# Patient Record
Sex: Female | Born: 1965 | Race: White | Hispanic: No | Marital: Married
Health system: Southern US, Community
[De-identification: ages and names within clinical notes are randomized; demographics above are authoritative.]

---

## 1997-10-28 ENCOUNTER — Other Ambulatory Visit: Admission: RE | Admit: 1997-10-28 | Discharge: 1997-10-28 | Payer: Self-pay | Admitting: Obstetrics and Gynecology

## 1998-04-20 ENCOUNTER — Other Ambulatory Visit: Admission: RE | Admit: 1998-04-20 | Discharge: 1998-04-20 | Payer: Self-pay | Admitting: Obstetrics and Gynecology

## 1998-11-24 ENCOUNTER — Other Ambulatory Visit: Admission: RE | Admit: 1998-11-24 | Discharge: 1998-11-24 | Payer: Self-pay | Admitting: Obstetrics and Gynecology

## 1999-09-02 ENCOUNTER — Ambulatory Visit (HOSPITAL_BASED_OUTPATIENT_CLINIC_OR_DEPARTMENT_OTHER): Admission: RE | Admit: 1999-09-02 | Discharge: 1999-09-02 | Payer: Self-pay | Admitting: *Deleted

## 2000-03-23 ENCOUNTER — Other Ambulatory Visit: Admission: RE | Admit: 2000-03-23 | Discharge: 2000-03-23 | Payer: Self-pay | Admitting: Obstetrics and Gynecology

## 2001-10-30 ENCOUNTER — Other Ambulatory Visit: Admission: RE | Admit: 2001-10-30 | Discharge: 2001-10-30 | Payer: Self-pay | Admitting: Obstetrics and Gynecology

## 2003-02-24 ENCOUNTER — Other Ambulatory Visit: Admission: RE | Admit: 2003-02-24 | Discharge: 2003-02-24 | Payer: Self-pay | Admitting: Obstetrics and Gynecology

## 2004-07-21 ENCOUNTER — Other Ambulatory Visit: Admission: RE | Admit: 2004-07-21 | Discharge: 2004-07-21 | Payer: Self-pay | Admitting: Obstetrics and Gynecology

## 2005-09-28 ENCOUNTER — Other Ambulatory Visit: Admission: RE | Admit: 2005-09-28 | Discharge: 2005-09-28 | Payer: Self-pay | Admitting: Obstetrics and Gynecology

## 2005-12-02 ENCOUNTER — Encounter: Admission: RE | Admit: 2005-12-02 | Discharge: 2005-12-02 | Payer: Self-pay | Admitting: Obstetrics and Gynecology

## 2010-07-25 ENCOUNTER — Encounter: Payer: Self-pay | Admitting: Obstetrics and Gynecology

## 2018-07-10 ENCOUNTER — Other Ambulatory Visit: Payer: Self-pay | Admitting: Obstetrics and Gynecology

## 2018-07-10 DIAGNOSIS — Z803 Family history of malignant neoplasm of breast: Secondary | ICD-10-CM

## 2018-08-09 ENCOUNTER — Other Ambulatory Visit: Payer: Self-pay

## 2018-09-25 ENCOUNTER — Other Ambulatory Visit: Payer: Self-pay

## 2018-10-23 ENCOUNTER — Other Ambulatory Visit: Payer: Self-pay

## 2018-11-27 ENCOUNTER — Ambulatory Visit
Admission: RE | Admit: 2018-11-27 | Discharge: 2018-11-27 | Disposition: A | Discharge status: Home / Self Care | Payer: Managed Care, Other (non HMO) | Source: Ambulatory Visit | Attending: Obstetrics and Gynecology | Admitting: Obstetrics and Gynecology

## 2018-11-27 ENCOUNTER — Other Ambulatory Visit: Payer: Self-pay

## 2018-11-27 DIAGNOSIS — Z803 Family history of malignant neoplasm of breast: Secondary | ICD-10-CM

## 2018-11-27 MED ORDER — GADOBUTROL 1 MMOL/ML IV SOLN
8.0000 mL | Freq: Once | INTRAVENOUS | Status: AC | PRN
  Administered 2018-11-27: 8 mL via INTRAVENOUS

## 2018-11-28 ENCOUNTER — Other Ambulatory Visit: Payer: Self-pay | Admitting: Obstetrics and Gynecology

## 2018-11-28 DIAGNOSIS — R9389 Abnormal findings on diagnostic imaging of other specified body structures: Secondary | ICD-10-CM

## 2018-12-06 ENCOUNTER — Ambulatory Visit
Admission: RE | Admit: 2018-12-06 | Discharge: 2018-12-06 | Disposition: A | Discharge status: Home / Self Care | Payer: Managed Care, Other (non HMO) | Source: Ambulatory Visit | Attending: Obstetrics and Gynecology | Admitting: Obstetrics and Gynecology

## 2018-12-06 ENCOUNTER — Other Ambulatory Visit: Payer: Self-pay

## 2018-12-06 DIAGNOSIS — R9389 Abnormal findings on diagnostic imaging of other specified body structures: Secondary | ICD-10-CM

## 2018-12-06 MED ORDER — GADOBUTROL 1 MMOL/ML IV SOLN
8.0000 mL | Freq: Once | INTRAVENOUS | Status: AC | PRN
  Administered 2018-12-06: 8 mL via INTRAVENOUS

## 2019-08-30 ENCOUNTER — Other Ambulatory Visit: Payer: Self-pay | Admitting: Obstetrics and Gynecology

## 2019-08-30 DIAGNOSIS — Z9189 Other specified personal risk factors, not elsewhere classified: Secondary | ICD-10-CM

## 2019-10-23 ENCOUNTER — Ambulatory Visit
Admission: RE | Admit: 2019-10-23 | Discharge: 2019-10-23 | Disposition: A | Discharge status: Home / Self Care | Payer: Commercial Managed Care - PPO | Source: Ambulatory Visit | Attending: Obstetrics and Gynecology | Admitting: Obstetrics and Gynecology

## 2019-10-23 DIAGNOSIS — Z9189 Other specified personal risk factors, not elsewhere classified: Secondary | ICD-10-CM

## 2019-10-23 MED ORDER — GADOBUTROL 1 MMOL/ML IV SOLN
9.0000 mL | Freq: Once | INTRAVENOUS | Status: AC | PRN
  Administered 2019-10-23: 9 mL via INTRAVENOUS

## 2020-07-11 ENCOUNTER — Other Ambulatory Visit: Payer: Self-pay

## 2020-07-11 ENCOUNTER — Other Ambulatory Visit: Payer: Commercial Managed Care - PPO

## 2020-07-11 DIAGNOSIS — Z20822 Contact with and (suspected) exposure to covid-19: Secondary | ICD-10-CM

## 2020-07-16 LAB — NOVEL CORONAVIRUS, NAA: SARS-CoV-2, NAA: DETECTED — AB

## 2020-12-15 ENCOUNTER — Other Ambulatory Visit: Payer: Self-pay | Admitting: Obstetrics and Gynecology

## 2020-12-15 DIAGNOSIS — Z803 Family history of malignant neoplasm of breast: Secondary | ICD-10-CM

## 2021-01-18 ENCOUNTER — Ambulatory Visit
Admission: RE | Admit: 2021-01-18 | Discharge: 2021-01-18 | Disposition: A | Discharge status: Home / Self Care | Payer: Commercial Managed Care - PPO | Source: Ambulatory Visit | Attending: Obstetrics and Gynecology | Admitting: Obstetrics and Gynecology

## 2021-01-18 ENCOUNTER — Other Ambulatory Visit: Payer: Self-pay

## 2021-01-18 DIAGNOSIS — Z803 Family history of malignant neoplasm of breast: Secondary | ICD-10-CM

## 2021-01-18 MED ORDER — GADOBUTROL 1 MMOL/ML IV SOLN
8.0000 mL | Freq: Once | INTRAVENOUS | Status: AC | PRN
  Administered 2021-01-18: 8 mL via INTRAVENOUS

## 2021-10-15 IMAGING — MR MR BREAST BILAT WO/W CM
9 of 13 series · 33 of 48 positions shown · IV contrast (gadavist)
Comparison: Prior exams including previous breast MRIs, most recent
dated 10/23/2019.

CLINICAL DATA: Family history of breast carcinoma. High risk
screening. Mother diagnosed with breast carcinoma at 68, sister at
55 and maternal aunt at 69. History of previous bilateral benign
breast biopsies in 5151.

LABS:  No labs drawn at time of imaging.
EXAM:
BILATERAL BREAST MRI WITH AND WITHOUT CONTRAST
TECHNIQUE: Multiplanar, multisequence MR images of both breasts were obtained
prior to and following the intravenous administration of 8 ml of
Gadavist

[Series 2: t2_tirm_tra ipat (a-p) · axial · 3.0mm · 0.70mm/px · 1 of 55 slices shown]
[im 1/55]
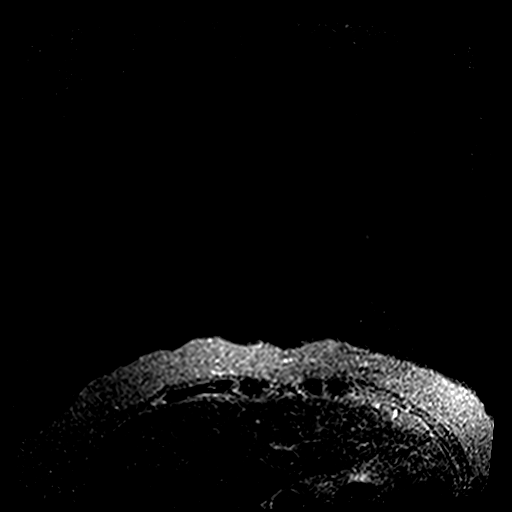

[Series 3: fl3d pre-cm no · axial · non-contrast · 1.2mm · 0.94mm/px · z∈[-113,+59]mm · 4 of 144 slices shown]
[im 1/144]
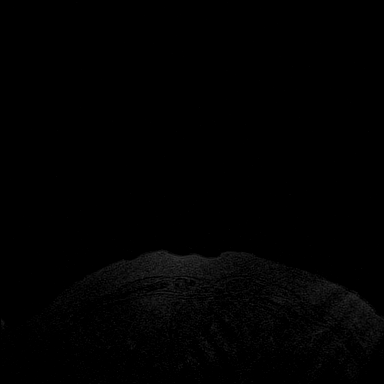
[im 48/144]
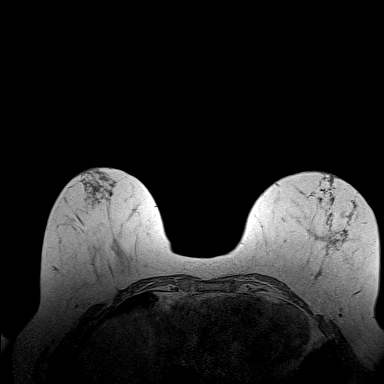
[im 96/144]
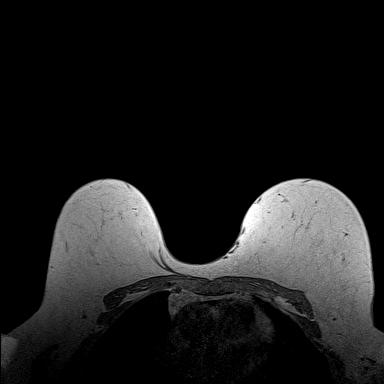
[im 144/144]
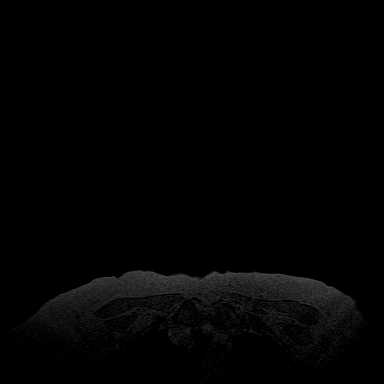

[Series 4: fl3d pre-cm · axial · non-contrast · 1.2mm · 0.94mm/px · z∈[-113,+59]mm · 5 of 144 slices shown]
[im 1/144]
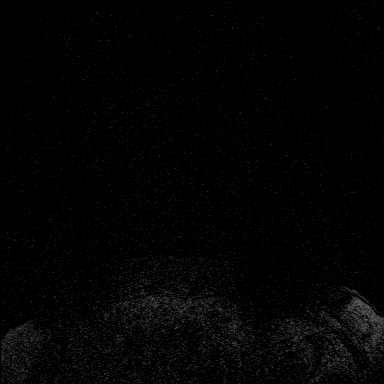
[im 36/144]
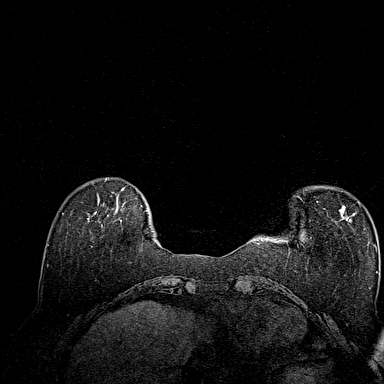
[im 72/144]
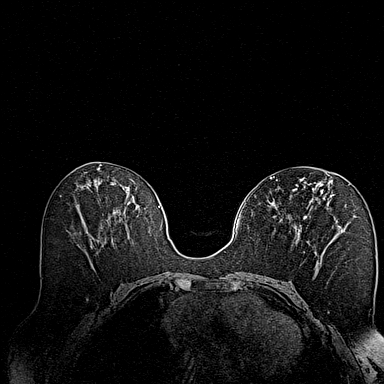
[im 108/144]
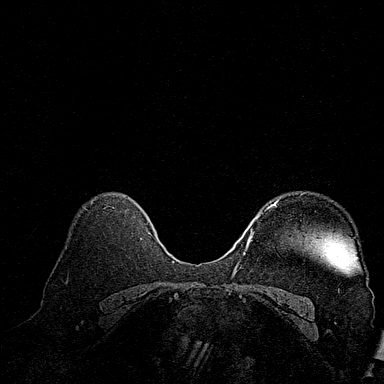
[im 144/144]
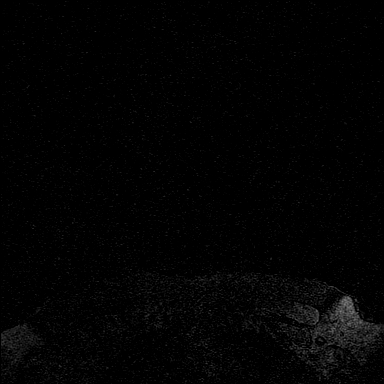

[Series 6: fl3d pre-cm rep · axial · non-contrast · 1.2mm · 0.94mm/px · z∈[-109,+62]mm · 5 of 144 slices shown]
[im 1/144]
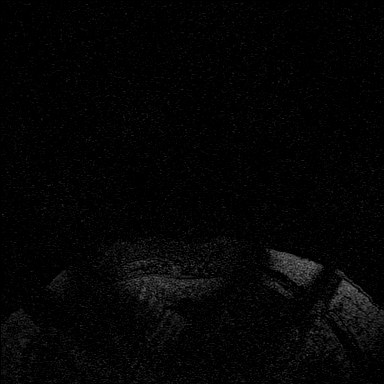
[im 36/144]
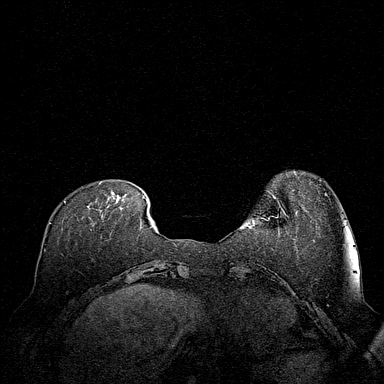
[im 72/144]
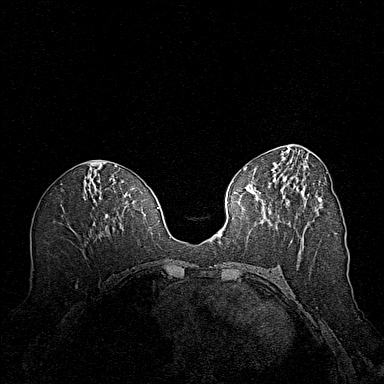
[im 108/144]
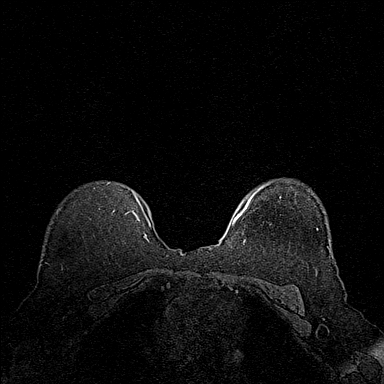
[im 144/144]
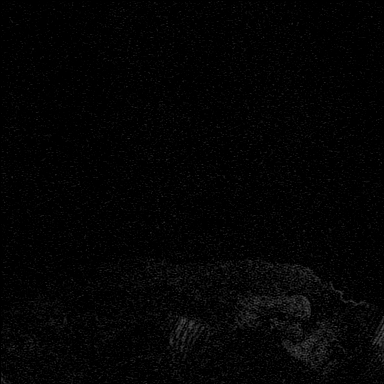

[Series 7: fl3d post-cm 20 · axial · 1.2mm · 0.94mm/px · z∈[-109,+62]mm · 5 of 144 slices shown (1 of 3)]
[im 1/144]
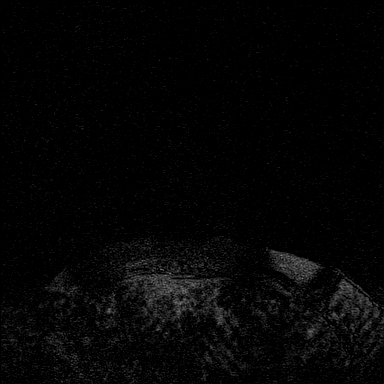
[im 36/144]
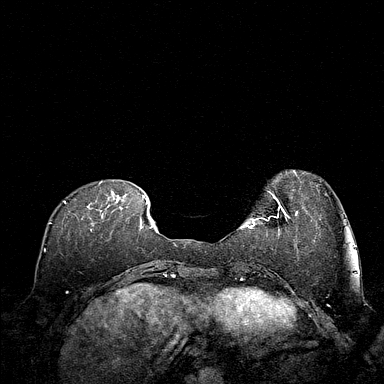
[im 72/144]
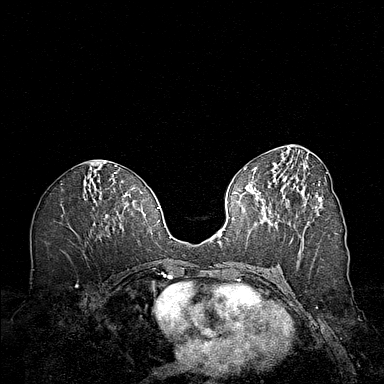
[im 108/144]
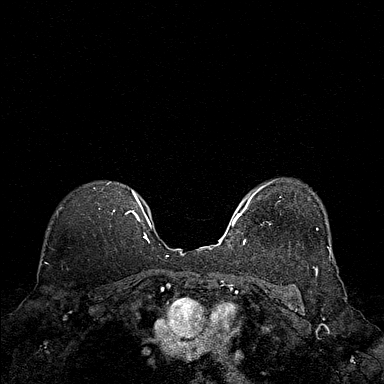
[im 144/144]
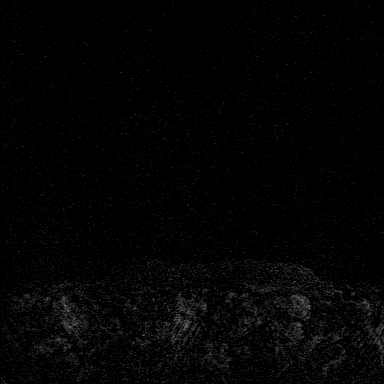

[Series 8: fl3d post-cm 20 · axial · 1.2mm · 0.94mm/px · z∈[-109,+62]mm · 5 of 144 slices shown (2 of 3)]
[im 1/144]
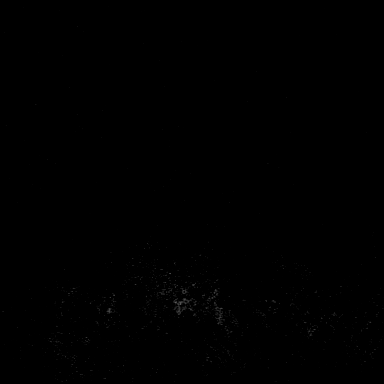
[im 36/144]
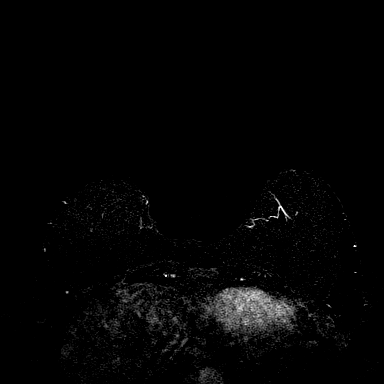
[im 72/144]
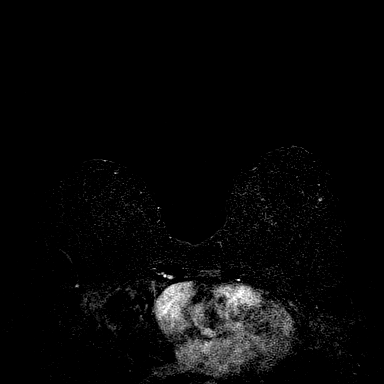
[im 108/144]
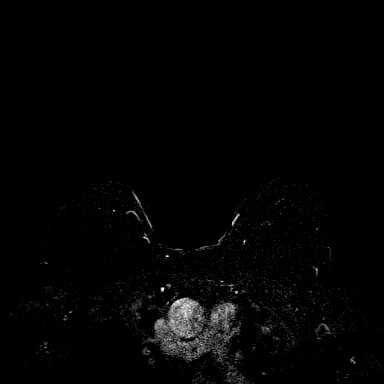
[im 144/144]
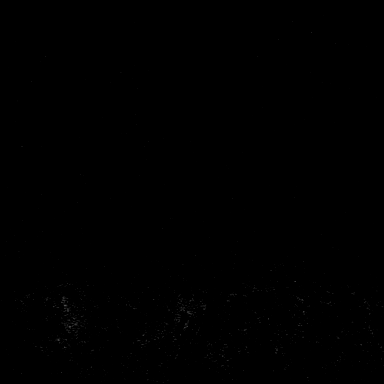

[Series 9: fl3d post-cm 20 · axial · 172.8mm · 0.94mm/px · 1 of 1 slices shown (3 of 3)]
[im 1/1]
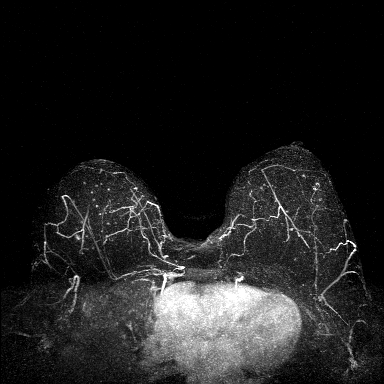

[Series 10: fl3d post-cm 3min · axial · 1.2mm · 0.94mm/px · z∈[-109,+62]mm · 5 of 144 slices shown]
[im 1/144]
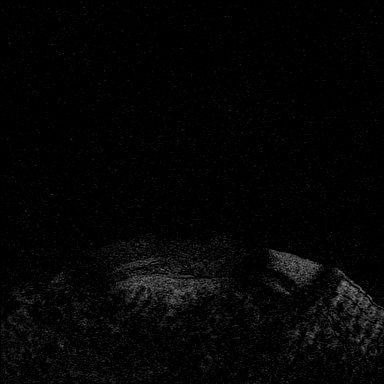
[im 36/144]
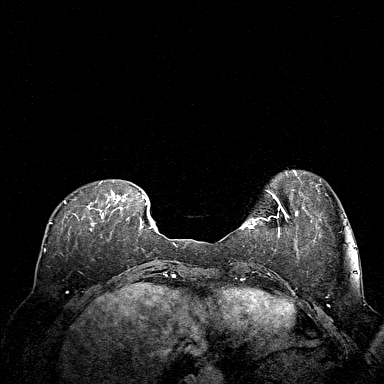
[im 72/144]
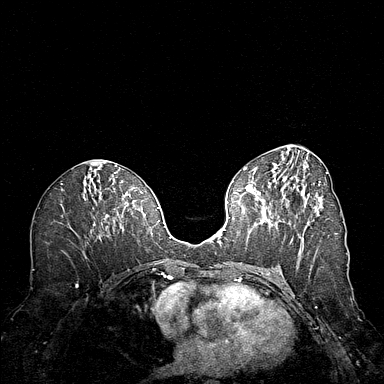
[im 108/144]
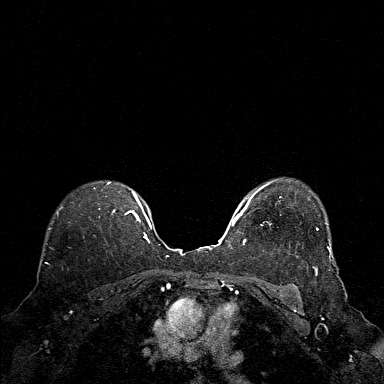
[im 144/144]
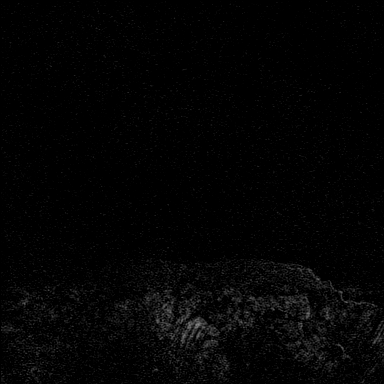

[Series 11: fl3d post-cm 3min_sub · axial · 1.2mm · 0.94mm/px · z∈[-109,-67]mm · 2 of 144 slices shown]
[im 1/144]
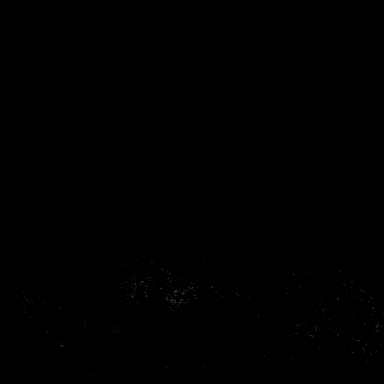
[im 36/144]
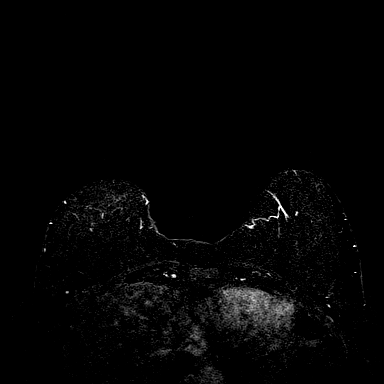

[33 of 48 positions shown; findings below may reference images not displayed]

Three-dimensional MR images were rendered by post-processing of the
original MR data on an independent workstation. The
three-dimensional MR images were interpreted, and findings are
reported in the following complete MRI report for this study. Three
dimensional images were evaluated at the independent interpreting
workstation using the DynaCAD thin client.
FINDINGS: Breast composition: b. Scattered fibroglandular tissue.

Background parenchymal enhancement: Minimal

Right breast: No mass or abnormal enhancement.

Left breast: No mass or abnormal enhancement.

Lymph nodes: No abnormal appearing lymph nodes.

Ancillary findings:  None.
IMPRESSION: No MRI evidence of breast malignancy.

RECOMMENDATION:
1. Annual screening mammography.
2. Consider continuing annual high risk screening breast MRI given
the patient's elevated lifetime risk for developing breast carcinoma
based on family history.

BI-RADS CATEGORY  1: Negative.

## 2022-03-17 ENCOUNTER — Ambulatory Visit (INDEPENDENT_AMBULATORY_CARE_PROVIDER_SITE_OTHER): Payer: BC Managed Care – PPO

## 2022-03-17 ENCOUNTER — Encounter: Payer: Self-pay | Admitting: Podiatry

## 2022-03-17 ENCOUNTER — Ambulatory Visit: Payer: BC Managed Care – PPO | Admitting: Podiatry

## 2022-03-17 DIAGNOSIS — M76822 Posterior tibial tendinitis, left leg: Secondary | ICD-10-CM | POA: Diagnosis not present

## 2022-03-17 DIAGNOSIS — M25572 Pain in left ankle and joints of left foot: Secondary | ICD-10-CM

## 2022-03-17 MED ORDER — TRIAMCINOLONE ACETONIDE 10 MG/ML IJ SUSP
10.0000 mg | Freq: Once | INTRAMUSCULAR | Status: AC
  Administered 2022-03-17: 10 mg

## 2022-03-17 NOTE — Progress Notes (Signed)
Subjective:   Patient ID: Kathy Sims, female   DOB: 56 y.o.   MRN: [id number]   HPI Patient presents stating that she has a lot of pain in the inside of her left ankle x5 weeks.  She did traumatize it prior to that and bumped the inside of her left ankle and that seems like the pain started after that.  Patient does not smoke likes to be active   ROS      Objective:  Physical Exam  Neurovascular status found to be intact muscle strength was found to be adequate range of motion within normal limits.  Patient does have normal muscle strength posterior tibial left when checked and was found to have inflammation pain is the posterior tibial tendon comes under the medial malleolus.  Patient has good shoe perfusion well oriented x3     Assessment:  Posterior tibial tendinitis left with possible trauma precipitating condition     Plan:  H&P reviewed condition went ahead today did sterile prep and injected the sheath of the tendon 3 mg dexamethasone Kenalog 5 mg Xylocaine after explaining chances and risk and I went ahead and applied fascial brace to lift up the arch instructed on ice therapy reappoint to recheck 3 weeks  X-rays indicate moderate depression of the arch no other indication of acute trauma to the area

## 2022-03-18 ENCOUNTER — Other Ambulatory Visit: Payer: Self-pay | Admitting: Podiatry

## 2022-03-18 DIAGNOSIS — M76822 Posterior tibial tendinitis, left leg: Secondary | ICD-10-CM

## 2022-04-07 ENCOUNTER — Ambulatory Visit: Payer: BC Managed Care – PPO | Admitting: Podiatry

## 2022-04-07 ENCOUNTER — Encounter: Payer: Self-pay | Admitting: Podiatry

## 2022-04-07 DIAGNOSIS — M76822 Posterior tibial tendinitis, left leg: Secondary | ICD-10-CM

## 2022-04-07 DIAGNOSIS — M25572 Pain in left ankle and joints of left foot: Secondary | ICD-10-CM | POA: Diagnosis not present

## 2022-04-07 NOTE — Progress Notes (Signed)
Subjective:   Patient ID: Kathy Sims, female   DOB: 56 y.o.   MRN: [id number]   HPI Patient states she is still having moderate pain in her left heel and states that the ankle also is at times sore with improvement with the brace but still noticing   ROS      Objective:  Physical Exam  Neurovascular status intact with pain more around the posterior tibial insertion versus the heel itself with range of motion adequate and better than last visit with muscle strength found to be within normal limits.  Patient is found to have still mild inflammation and swelling more proximal currently     Assessment:  Posterior tibial tendinitis fasciitis condition left that has improved still present     Plan:  H&P spent a great deal time going over condition and recommending continued brace usage supportive shoes ice therapy anti-inflammatories as needed and if this were to persist may require cast immobilization eventual MRI and I educated her on the condition and structure function of the tendon

## 2022-08-11 ENCOUNTER — Ambulatory Visit: Payer: BC Managed Care – PPO | Admitting: Podiatry

## 2022-08-11 DIAGNOSIS — M76822 Posterior tibial tendinitis, left leg: Secondary | ICD-10-CM | POA: Diagnosis not present

## 2022-08-11 DIAGNOSIS — T148XXA Other injury of unspecified body region, initial encounter: Secondary | ICD-10-CM

## 2022-08-12 NOTE — Progress Notes (Signed)
Subjective:   Patient ID: Kathy Sims, female   DOB: 57 y.o.   MRN: [id number]   HPI Patient states she still has a lot of swelling and pain in the left ankle and it is simply not improved and she is not able to be active.  She knows she should have been earlier but her father passed away and she has been with him for a lot prior   ROS      Objective:  Physical Exam  Neurovascular status intact with quite a bit of swelling of the posterior tibial tendon as it comes underneath the medial malleolus left and muscle strength may be moderately reduced with no indications of a progressive flatfoot deformity     Assessment:  Dysfunction of the posterior tibial tendon left with inflammation pain and possibility for interstitial tear     Plan:  H&P reviewed and at this point I went ahead and I applied air fracture walker to the left lower leg to immobilize along with aggressive ice therapy and I am ordering MRI to rule out tear.  We will get the results and decide what might be necessary for her long-term

## 2022-08-25 ENCOUNTER — Ambulatory Visit
Admission: RE | Admit: 2022-08-25 | Discharge: 2022-08-25 | Disposition: A | Discharge status: Home / Self Care | Payer: BC Managed Care – PPO | Source: Ambulatory Visit | Attending: Podiatry | Admitting: Podiatry

## 2022-08-25 DIAGNOSIS — T148XXA Other injury of unspecified body region, initial encounter: Secondary | ICD-10-CM

## 2022-08-25 DIAGNOSIS — M76822 Posterior tibial tendinitis, left leg: Secondary | ICD-10-CM

## 2022-09-05 NOTE — Progress Notes (Signed)
You want this case. Should be a straight forward repair

## 2022-09-07 ENCOUNTER — Ambulatory Visit: Payer: BC Managed Care – PPO | Admitting: Podiatry

## 2022-09-07 ENCOUNTER — Ambulatory Visit (INDEPENDENT_AMBULATORY_CARE_PROVIDER_SITE_OTHER): Payer: BC Managed Care – PPO

## 2022-09-07 DIAGNOSIS — S86112A Strain of other muscle(s) and tendon(s) of posterior muscle group at lower leg level, left leg, initial encounter: Secondary | ICD-10-CM

## 2022-09-07 NOTE — Progress Notes (Signed)
Subjective:  Patient ID: Kathy Sims, female    DOB: 05-08-1966,  MRN: [id number]  Chief Complaint  Patient presents with   Results Review     Rm 1 Left tendinitis pain and MRI review.     57 y.o. female presents with the above complaint. History confirmed with patient.  She presents today for follow-up of an MRI she is referred by Dr. Paulla Dolly for surgical consultation, she initially developed an injury in August and September of last year, rolled her ankle and an has had chronic pain since then.  So far has had boot immobilization, home therapy, anti-inflammatories and an injection.  Has improved a little bit with the boot but still has pain and tenderness especially at night when she sleeps without the boot on  Objective:  Physical Exam: warm, good capillary refill, no trophic changes or ulcerative lesions, normal DP and PT pulses, and normal sensory exam. Left Foot:  She has pain and tenderness along the posterior tibial tendon, weakness and pain with resisted inversion, decreased strength, she has no equinus, edema on the medial ankle and no gross instability, rectus foot type without pes planus    Radiographs: Multiple views x-ray of the left foot: Previous radiographs were reviewed, she is a relatively rectus foot type, no pes planus deformity, calcaneal axial views today do not show any significant calcaneal valgus  Study Result  Narrative & Impression  CLINICAL DATA:  Ankle pain, tendon abnormality suspected, neg xray rule out tear of post tib tendon left   EXAM: MRI OF THE LEFT ANKLE WITHOUT CONTRAST   TECHNIQUE: Multiplanar, multisequence MR imaging of the ankle was performed. No intravenous contrast was administered.   COMPARISON:  Ankle radiograph 08/11/2022   FINDINGS: TENDONS   Peroneal: Intact peroneus longus and peroneus brevis tendons.   Posteromedial: There is a longitudinal split tear of the posterior tibial tendon at and below the medial malleolus,  tear measuring at least 2.4 cm in length, and potentially extending above the malleolus. There is associated reactive tenosynovitis. There is reactive tenosynovitis of the flexor digitorum longus tendon, which is intact. Intact flexor hallucis longus tendon.   Anterior: Intact tibialis anterior, extensor hallucis longus and extensor digitorum longus tendons.   Achilles: Intact.   Plantar Fascia: Intact.   LIGAMENTS   Lateral: Anterior talofibular ligament intact. Calcaneofibular ligament intact. Posterior talofibular ligament intact. Anterior and posterior tibiofibular ligaments intact.   Medial: Deltoid ligament intact. Spring ligament intact.   CARTILAGE   Ankle Joint: No significant joint effusion. No osteochondral defect.   Subtalar Joints/Sinus Tarsi: Normal subtalar joints. No subtalar joint effusion. Normal sinus tarsi.   Bones: Reactive marrow edema the medial malleolus. No evidence of acute fracture.   Soft Tissue: Ankle soft tissue swelling most prominent medially. No focal fluid collection. There is edema in the tarsal tunnel.   IMPRESSION: Longitudinal split tear of the PT tendon at and below the medial malleolus, with reactive tenosynovitis. Tear may extend above the malleolus as well. Reactive marrow edema in the medial malleolus. No evidence of ligamentous injury.     Electronically Signed   By: Maurine Simmering M.D.   On: 08/29/2022 08:32   Assessment:   1. Traumatic rupture of left posterior tibial tendon, initial encounter      Plan:  Patient was evaluated and treated and all questions answered.  We reviewed the results of her MRI as well as her clinical progression.  She does have a fairly sizable tear as a result  of a previous traumatic injury confirmed by her MRI.  She has made a reasonable attempt at nonsurgical treatment thus far.  As the injury has been about 6 months in duration and is still unable to ambulate completely without the boot I  recommend surgical treatment and repair of the rupture.  Surgically we discussed the direct repair of the posterior tibial tendon, I discussed with her that an intraoperative decision will be made regarding the quality and health of the tendon.  Likely she has reactive tenosynovitis as well.  I reviewed the MRI images independently and agree that the deltoid and spring ligaments appear to be intact, we will recheck this during the operative procedure with stress examination.  I discussed with her that if the tendon is insufficient in appearance FDL transfer will likely be necessary, if there is sufficient healthy tendon to repair I would directly repair and then potentially advance the tendon in a Kidner type procedure.  She does not have any equinus that requires surgical treatment.  Her foot type is rectus and I do not think any osteotomies are necessary at this point.  We discussed the risk benefits and potential complications of the procedure including but not limited to pain, swelling, infection, scar, numbness which may be temporary or permanent, chronic pain, stiffness, nerve pain or damage, wound healing problems.  She understands and wishes to proceed.  I discussed the postoperative course including the period of nonweightbearing for approximately 6 weeks and then subsequent therapy, we also discussed that with this type but she may return to work if she is able to ice and elevate appropriately utilizing a knee scooter.  All questions were addressed.  No guarantees to the outcome of the surgery could be made.  Surgery will be scheduled at a mutually agreeable date    Surgical plan:  Procedure: -Repair of left posterior tibial tendon, possible tendon advancement into the navicular and/or tendon transfer of FDL  Location: -[facility]  Anesthesia plan: -IV sedation with a regional block  Postoperative pain plan: - Tylenol 1000 mg every 6 hours, ibuprofen 600 mg every 8 hours, gabapentin 300 mg every  8 hours x5 days, oxycodone 5 mg 1-2 tabs every 6 hours only as needed  DVT prophylaxis: -ASA 325 mg twice daily  WB Restrictions / DME needs: -NWB in splint postop    No follow-ups on file.

## 2022-09-15 ENCOUNTER — Telehealth: Payer: Self-pay | Admitting: Urology

## 2022-09-15 NOTE — Telephone Encounter (Signed)
DOS - 10/14/22  REPAIR POST TIBAL TENDON LEFT --- [id number] TENODESIS LEFT --- [id number]  [employer]   SPOKE WITH RAE J. WITH [employer] AND SHE STATED THAT FOR CPT CODES 91478 AND 29562 NO PRIOR AUTH IS REQUIRED.  CALL REF # [id number]

## 2022-10-14 ENCOUNTER — Other Ambulatory Visit: Payer: Self-pay | Admitting: Podiatry

## 2022-10-14 DIAGNOSIS — M66372 Spontaneous rupture of flexor tendons, left ankle and foot: Secondary | ICD-10-CM

## 2022-10-14 MED ORDER — GABAPENTIN 300 MG PO CAPS: 300.0000 mg | ORAL_CAPSULE | Freq: Three times a day (TID) | ORAL | 0 refills | Status: DC

## 2022-10-14 MED ORDER — ACETAMINOPHEN 500 MG PO TABS: 1000.0000 mg | ORAL_TABLET | Freq: Four times a day (QID) | ORAL | 0 refills | Status: AC | PRN

## 2022-10-14 MED ORDER — OXYCODONE HCL 5 MG PO TABS: 5.0000 mg | ORAL_TABLET | ORAL | 0 refills | Status: AC | PRN

## 2022-10-14 MED ORDER — ASPIRIN 325 MG PO TBEC: 325.0000 mg | DELAYED_RELEASE_TABLET | Freq: Two times a day (BID) | ORAL | 0 refills | Status: AC

## 2022-10-14 MED ORDER — IBUPROFEN 600 MG PO TABS: 600.0000 mg | ORAL_TABLET | Freq: Three times a day (TID) | ORAL | 0 refills | Status: AC | PRN

## 2022-10-14 NOTE — Progress Notes (Signed)
4/12 PT tendon repair

## 2022-10-20 ENCOUNTER — Ambulatory Visit (INDEPENDENT_AMBULATORY_CARE_PROVIDER_SITE_OTHER): Payer: BC Managed Care – PPO | Admitting: Podiatry

## 2022-10-20 ENCOUNTER — Ambulatory Visit (INDEPENDENT_AMBULATORY_CARE_PROVIDER_SITE_OTHER): Payer: BC Managed Care – PPO

## 2022-10-20 DIAGNOSIS — S86112D Strain of other muscle(s) and tendon(s) of posterior muscle group at lower leg level, left leg, subsequent encounter: Secondary | ICD-10-CM

## 2022-10-20 DIAGNOSIS — T148XXA Other injury of unspecified body region, initial encounter: Secondary | ICD-10-CM

## 2022-10-20 NOTE — Progress Notes (Signed)
  Subjective:  Patient ID: Kathy Sims, female    DOB: 07-15-1965,  MRN: [id number]  Chief Complaint  Patient presents with   Routine Post Op    POV # 1 DOS 10/14/22 --- REPAIR OF LEFT ANKLE TENDON, POSSIBLE TENDON ADVANCEMENT OR TRANSFER    57 y.o. female returns for post-op check.  She is doing well she not having much pain, the oxycodone made her dizzy  Review of Systems: Negative except as noted in the HPI. Denies N/V/F/Ch.   Objective:  There were no vitals filed for this visit. There is no height or weight on file to calculate BMI. Constitutional Well developed. Well nourished.  Vascular Foot warm and well perfused. Capillary refill normal to all digits.  Calf is soft and supple, no posterior calf or knee pain, negative Homans' sign  Neurologic Normal speech. Oriented to person, place, and time. Epicritic sensation to light touch grossly present bilaterally.  Dermatologic Skin healing well without signs of infection. Skin edges well coapted without signs of infection.  Orthopedic: Tenderness to palpation noted about the surgical site.  Mild edema   Multiple view plain film radiographs: Interval resection of portion of the navicular tuberosity with bone anchor intact Assessment:   1. Traumatic rupture of left posterior tibial tendon, subsequent encounter   2. Torn tendon    Plan:  Patient was evaluated and treated and all questions answered.  S/p foot surgery left -Progressing as expected post-operatively. -XR: Noted above, no complication -WB Status: NWB in postoperative fiberglass splint this was reapplied today, she will remove this next week and transition back to her removable boot so that she may bathe.  Maintain boot at all times except for bathing until next visit -Sutures: Return in 2 weeks for removal. -Medications: No refills required -Foot redressed.  Return in about 2 weeks (around 11/03/2022) for post op (no x-rays), suture removal.

## 2022-11-01 ENCOUNTER — Other Ambulatory Visit: Payer: Self-pay | Admitting: Obstetrics and Gynecology

## 2022-11-01 DIAGNOSIS — Z803 Family history of malignant neoplasm of breast: Secondary | ICD-10-CM

## 2022-11-03 ENCOUNTER — Ambulatory Visit (INDEPENDENT_AMBULATORY_CARE_PROVIDER_SITE_OTHER): Payer: BC Managed Care – PPO | Admitting: Podiatry

## 2022-11-03 DIAGNOSIS — S86112D Strain of other muscle(s) and tendon(s) of posterior muscle group at lower leg level, left leg, subsequent encounter: Secondary | ICD-10-CM

## 2022-11-03 NOTE — Progress Notes (Signed)
  Subjective:  Patient ID: Kathy Sims, female    DOB: 1966-03-20,  MRN: [id number]  Chief Complaint  Patient presents with   Routine Post Op    POV # 2 DOS 10/14/22--- REPAIR OF LEFT ANKLE TENDON, POSSIBLE TENDON ADVANCEMENT OR TRANSFER    57 y.o. female returns for post-op check.  Doing well pain is improving she has been showering as directed and utilizing the boot  Review of Systems: Negative except as noted in the HPI. Denies N/V/F/Ch.   Objective:  There were no vitals filed for this visit. There is no height or weight on file to calculate BMI. Constitutional Well developed. Well nourished.  Vascular Foot warm and well perfused. Capillary refill normal to all digits.  Calf is soft and supple, no posterior calf or knee pain, negative Homans' sign  Neurologic Normal speech. Oriented to person, place, and time. Epicritic sensation to light touch grossly present bilaterally.  Dermatologic Skin healing well without signs of infection. Skin edges well coapted without signs of infection.  Orthopedic: She has little to no tenderness to palpation noted about the surgical site.  Mild edema   Multiple view plain film radiographs: Interval resection of portion of the navicular tuberosity with bone anchor intact Assessment:   1. Traumatic rupture of left posterior tibial tendon, subsequent encounter    Plan:  Patient was evaluated and treated and all questions answered.  S/p foot surgery left -Overall doing very well.  Sutures removed.  Steri-Strips applied.  May continue showering.  Can remove boot to begin gentle range of motion exercises of ankle joint.  [facility] sent, she will begin full PT without restrictions at the next visit.  Plan to transition to weightbearing in boot after next visit as well.  Return in about 3 weeks (around 11/24/2022) for post op (no x-rays).

## 2022-11-24 ENCOUNTER — Ambulatory Visit (INDEPENDENT_AMBULATORY_CARE_PROVIDER_SITE_OTHER): Payer: BC Managed Care – PPO | Admitting: Podiatry

## 2022-11-24 DIAGNOSIS — M76822 Posterior tibial tendinitis, left leg: Secondary | ICD-10-CM

## 2022-11-24 DIAGNOSIS — S86112D Strain of other muscle(s) and tendon(s) of posterior muscle group at lower leg level, left leg, subsequent encounter: Secondary | ICD-10-CM

## 2022-11-24 NOTE — Progress Notes (Signed)
  Subjective:  Patient ID: Kathy Sims, female    DOB: 24-Nov-1965,  MRN: [id number]  Chief Complaint  Patient presents with   Routine Post Op    Rm 21 POV # 3 DOS 10/14/22--- REPAIR OF LEFT ANKLE TENDON, POSSIBLE TENDON ADVANCEMENT OR TRANSFER. Pt states she is doing well. No nv, fever or chills. Pt ankle looks very good still having edema.     57 y.o. female returns for post-op check.  Continues to do well feels stiff  Review of Systems: Negative except as noted in the HPI. Denies N/V/F/Ch.   Objective:  There were no vitals filed for this visit. There is no height or weight on file to calculate BMI. Constitutional Well developed. Well nourished.  Vascular Foot warm and well perfused. Capillary refill normal to all digits.  Calf is soft and supple, no posterior calf or knee pain, negative Homans' sign  Neurologic Normal speech. Oriented to person, place, and time. Epicritic sensation to light touch grossly present bilaterally.  Dermatologic Incision is well not hypertrophic  Orthopedic: She has little to no tenderness to palpation noted about the surgical site.  Mild edema   Multiple view plain film radiographs: Interval resection of portion of the navicular tuberosity with bone anchor intact Assessment:   1. Traumatic rupture of left posterior tibial tendon, subsequent encounter   2. Posterior tibial tendinitis, left    Plan:  Patient was evaluated and treated and all questions answered.  S/p foot surgery left -Doing well, physical therapy did not receive the referral we had previously sent, it was resent today.  She will schedule this ASAP.  May begin WBAT in the cam walker boot immediately.  Advised her to do this for the next 4 to 5 weeks and then can gradually transition back to shoe gear.  Expect by next visit we will be able to transition back to full weightbearing as tolerated in regular shoes.  Return in about 6 weeks (around 01/05/2023) for post op (no x-rays).

## 2022-11-24 NOTE — Patient Instructions (Signed)
Call Phone: [phone] to schedule your PT

## 2022-12-12 ENCOUNTER — Encounter: Payer: Self-pay | Admitting: Obstetrics and Gynecology

## 2022-12-14 ENCOUNTER — Ambulatory Visit
Admission: RE | Admit: 2022-12-14 | Discharge: 2022-12-14 | Disposition: A | Discharge status: Home / Self Care | Payer: BC Managed Care – PPO | Source: Ambulatory Visit | Attending: Obstetrics and Gynecology | Admitting: Obstetrics and Gynecology

## 2022-12-14 DIAGNOSIS — Z803 Family history of malignant neoplasm of breast: Secondary | ICD-10-CM

## 2022-12-14 MED ORDER — GADOPICLENOL 0.5 MMOL/ML IV SOLN
8.0000 mL | Freq: Once | INTRAVENOUS | Status: AC | PRN
  Administered 2022-12-14: 8 mL via INTRAVENOUS

## 2023-01-09 ENCOUNTER — Encounter: Payer: Self-pay | Admitting: Podiatry

## 2023-01-09 ENCOUNTER — Ambulatory Visit (INDEPENDENT_AMBULATORY_CARE_PROVIDER_SITE_OTHER): Payer: BC Managed Care – PPO | Admitting: Podiatry

## 2023-01-09 DIAGNOSIS — M76822 Posterior tibial tendinitis, left leg: Secondary | ICD-10-CM

## 2023-01-10 NOTE — Progress Notes (Signed)
Subjective:   Patient ID: Kathy Sims, female   DOB: 57 y.o.   MRN: [id number]   HPI Patient presents after having tendon repair left of several months duration states overall doing well and probably long-term knows she needs more support wearing Hoka's currently   ROS      Objective:  Physical Exam  Neurovascular status intact incision site medial aspect left ankle that is healing well muscle strength tendon strength adequate currently     Assessment:  Doing well with surgery     Plan:  Discussed orthotics and she is going to get approval from her insurance company but due to the surgery and the tension on the posterior tibial tendon I think this is appropriate and necessary in her particular case.  Patient will be seen back for reevaluation Dr. Lilian Kapur in the next several months

## 2023-10-25 ENCOUNTER — Emergency Department (HOSPITAL_BASED_OUTPATIENT_CLINIC_OR_DEPARTMENT_OTHER)

## 2023-10-25 ENCOUNTER — Emergency Department (HOSPITAL_BASED_OUTPATIENT_CLINIC_OR_DEPARTMENT_OTHER)
Admission: EM | Admit: 2023-10-25 | Discharge: 2023-10-25 | Disposition: A | Discharge status: Home / Self Care | Attending: Emergency Medicine | Admitting: Emergency Medicine

## 2023-10-25 ENCOUNTER — Ambulatory Visit: Payer: Self-pay

## 2023-10-25 ENCOUNTER — Encounter (HOSPITAL_BASED_OUTPATIENT_CLINIC_OR_DEPARTMENT_OTHER): Payer: Self-pay | Admitting: *Deleted

## 2023-10-25 ENCOUNTER — Other Ambulatory Visit: Payer: Self-pay

## 2023-10-25 DIAGNOSIS — M79604 Pain in right leg: Secondary | ICD-10-CM | POA: Diagnosis present

## 2023-10-25 DIAGNOSIS — M7121 Synovial cyst of popliteal space [Baker], right knee: Secondary | ICD-10-CM | POA: Insufficient documentation

## 2023-10-25 NOTE — ED Provider Notes (Signed)
 [facility] AT [facility] Provider Note   CSN: [id number] Arrival date & time: 10/25/23  1734     History Chief Complaint  Patient presents with   Leg Pain    Kathy Sims is a 58 y.o. female.  Patient without significant medical history presents to the emergency department today with concerns of leg pain.  She reports right leg pain and swelling behind the right knee and is concerned due to recently traveling on a prolonged flight from [place]  in the last week.  No prior history of PE or DVT.  Not on blood thinners.  She states that the pain noticeably worsened in the last few days and she presented for evaluation of possible DVT.  She does report that while she was in [place] , she did bump her knee prior to onset of her symptoms.  No history of similar symptoms.   Leg Pain      Home Medications Prior to Admission medications   Medication Sig Start Date End Date Taking? Authorizing Provider  valACYclovir (VALTREX) 1000 MG tablet valacyclovir 1 gram tablet    [provider]      Allergies    Patient has no known allergies.    Review of Systems   Review of Systems  Musculoskeletal:        Knee pain  All other systems reviewed and are negative.   Physical Exam Updated Vital Signs BP (!) 148/94 (BP Location: Right Arm)   Pulse 65   Temp 97.8 F (36.6 C) (Oral)   Resp 14   Ht 5\' 7"  (1.702 m)   Wt 83.9 kg   SpO2 100%   BMI 28.98 kg/m  Physical Exam Vitals and nursing note reviewed.  Constitutional:      General: She is not in acute distress.    Appearance: She is well-developed.  HENT:     Head: Normocephalic and atraumatic.  Eyes:     Conjunctiva/sclera: Conjunctivae normal.  Cardiovascular:     Rate and Rhythm: Normal rate and regular rhythm.     Heart sounds: No murmur heard. Pulmonary:     Effort: Pulmonary effort is normal. No respiratory distress.     Breath sounds: Normal breath sounds.  Abdominal:      Palpations: Abdomen is soft.     Tenderness: There is no abdominal tenderness.  Musculoskeletal:        General: Swelling and tenderness present. No deformity or signs of injury.     Cervical back: Neck supple.       Legs:     Comments: Small area of swelling with fluctuance present at the posterior right knee.  No obvious masses appreciated.  Lower extremity pulses intact and symmetrical.  Skin:    General: Skin is warm and dry.     Capillary Refill: Capillary refill takes less than 2 seconds.  Neurological:     Mental Status: She is alert.  Psychiatric:        Mood and Affect: Mood normal.     ED Results / Procedures / Treatments   Labs (all labs ordered are listed, but only abnormal results are displayed) Labs Reviewed - No data to display  EKG None  Radiology US  Venous Img Lower Unilateral Right Result Date: 10/25/2023 CLINICAL DATA:  Right lower extremity edema. EXAM: RIGHT LOWER EXTREMITY VENOUS DOPPLER ULTRASOUND TECHNIQUE: Gray-scale sonography with graded compression, as well as color Doppler and duplex ultrasound were performed to evaluate the lower extremity deep venous systems  from the level of the common femoral vein and including the common femoral, femoral, profunda femoral, popliteal and calf veins including the posterior tibial, peroneal and gastrocnemius veins when visible. The superficial great saphenous vein was also interrogated. Spectral Doppler was utilized to evaluate flow at rest and with distal augmentation maneuvers in the common femoral, femoral and popliteal veins. COMPARISON:  None Available. FINDINGS: Contralateral Common Femoral Vein: Respiratory phasicity is normal and symmetric with the symptomatic side. No evidence of thrombus. Normal compressibility. Common Femoral Vein: No evidence of thrombus. Normal compressibility, respiratory phasicity and response to augmentation. Saphenofemoral Junction: No evidence of thrombus. Normal compressibility and flow on  color Doppler imaging. Profunda Femoral Vein: No evidence of thrombus. Normal compressibility and flow on color Doppler imaging. Femoral Vein: No evidence of thrombus. Normal compressibility, respiratory phasicity and response to augmentation. Popliteal Vein: No evidence of thrombus. Normal compressibility, respiratory phasicity and response to augmentation. Calf Veins: No evidence of thrombus. Normal compressibility and flow on color Doppler imaging. Superficial Great Saphenous Vein: No evidence of thrombus. Normal compressibility. Venous Reflux:  None. Other Findings: A 6.7 cm x 1.6 cm x 4.9 cm fluid collection is seen within the soft tissues of the right popliteal fossa. IMPRESSION: 1. No evidence of deep venous thrombosis within the RIGHT lower extremity. 2. RIGHT Baker's cyst. Electronically Signed   By: Virgle Grime M.D.   On: 10/25/2023 19:51    Procedures Procedures    Medications Ordered in ED Medications - No data to display  ED Course/ Medical Decision Making/ A&P                                 Medical Decision Making  This patient presents to the ED for concern of leg pain.  Differential diagnosis includes DVT, neuropathy, ischemic limb, Baker's cyst   Imaging Studies ordered:  I ordered imaging studies including US  of right lower leg  I independently visualized and interpreted imaging which showed negative for DVT, positive for Baker's cyst I agree with the radiologist interpretation   Problem List / ED Course:  Patient without significant medical history presents to the emergency department with concerns of leg pain.  Reports that she noticed a area of swelling and tenderness in the right posterior leg behind the knee shortly after returning home from a flight from [place] .  No prior history of PE or DVT.  Not on blood thinners.  She does not remark that she did hit her knee while she was in [place]  before her onset of symptoms.  Tolerates weightbearing at this  time. Physical exam shows a small area of likely fluid collection with some fluctuance present in the right posterior knee.  No obvious mass.  Ultrasound imaging obtained for further assessment. Ultrasound negative for DVT.  Positive for Baker's cyst.  I suspect this is likely the cause of patient's current symptoms.  Advised patient this is typically managed conservatively and is typically not a limiting condition that requires aggressive management. Advised patient to follow up with PCP for further assessment. Return precautions discussed. Patient otherwise stable for outpatient follow up and discharged home.  Final Clinical Impression(s) / ED Diagnoses Final diagnoses:  Baker's cyst of knee, right  Right leg pain    Rx / DC Orders ED Discharge Orders     None         Amariyah Bazar A, PA-C 10/25/23 2137    Kingsley, Victoria K,  DO 10/25/23 2346

## 2023-10-25 NOTE — Discharge Instructions (Addendum)
 You were seen in the emergency department today for concerns of leg pain.  Your ultrasound was thankfully negative with no signs of a clot present.  You do appear to have a Baker's cyst in the right knee which may be some of the swelling and discomfort you are noticing.  These do not typically need to be removed although they can be removed if your symptoms are worsening or notice more discomfort from the area of swelling.  I would recommend following up with your primary care provider for further evaluation.  If you feel your symptoms are acutely worsening, please return to the emergency department.

## 2023-10-25 NOTE — ED Triage Notes (Signed)
 Right leg pain and swelling with a hard mass on the posterior or right calf. This is new since a recent flight to [place]  last week.

## 2024-02-26 ENCOUNTER — Ambulatory Visit (INDEPENDENT_AMBULATORY_CARE_PROVIDER_SITE_OTHER)

## 2024-02-26 ENCOUNTER — Ambulatory Visit (INDEPENDENT_AMBULATORY_CARE_PROVIDER_SITE_OTHER): Admitting: Podiatry

## 2024-02-26 VITALS — Ht 67.0 in | Wt 185.0 lb

## 2024-02-26 DIAGNOSIS — M7672 Peroneal tendinitis, left leg: Secondary | ICD-10-CM

## 2024-02-26 DIAGNOSIS — M7752 Other enthesopathy of left foot: Secondary | ICD-10-CM | POA: Diagnosis not present

## 2024-02-26 DIAGNOSIS — S93602A Unspecified sprain of left foot, initial encounter: Secondary | ICD-10-CM

## 2024-02-26 NOTE — Patient Instructions (Signed)

## 2024-02-26 NOTE — Progress Notes (Signed)
  Subjective:  Patient ID: Kathy Sims, female    DOB: Dec 15, 1965,  MRN: [id number]  Chief Complaint  Patient presents with   Foot Pain    RM 10 Patient is here for left foot pain on the lateral. Patient states injuring foot while walking up a staircase. Patient is currently using a walking boot for support and pain relief.    58 y.o. female presents with the above complaint. History confirmed with patient.  This happened approximately 9 days ago.  She caught the foot on the step and forcibly plantarflexed her foot she thinks was carrying her granddaughter at the time and did not want to fall.  Otherwise doing well from her previous PT tendon repair last year says it feels tight sometimes but no pain or issues walking right now  Objective:  Physical Exam: warm, good capillary refill, no trophic changes or ulcerative lesions, normal DP and PT pulses, normal sensory exam, and no pain on PT tendon good 5 out of 5 strength, lateral no tenderness to palpation good 5 out of 5 strength.   Radiographs: Multiple views x-ray of the left foot: no fracture, dislocation, swelling or major degenerative changes noted Assessment:   1. Sprain of left foot, initial encounter   2. Peroneal tendinitis of left lower extremity      Plan:  Patient was evaluated and treated and all questions answered.  Discussed with her she likely has a sprain from a forced plantarflexion injury.  She is been wearing her cam boot for about a week and is feeling better.  Advised to wear for 1 more week and then work on home physical therapy plan.  If not improved in 4 to 6 weeks would recommend MRI to evaluate.  RICE protocol reviewed.  Follow-up in the as needed  Return if symptoms worsen or fail to improve.
